# Patient Record
Sex: Male | Born: 1995 | Race: White | Hispanic: No | Marital: Single | Smoking: Never smoker
Health system: Southern US, Community
[De-identification: ages and names within clinical notes are randomized; demographics above are authoritative.]

## PROBLEM LIST (undated history)

## (undated) HISTORY — PX: WISDOM TOOTH EXTRACTION: SHX21

---
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
## 2013-04-04 ENCOUNTER — Ambulatory Visit (INDEPENDENT_AMBULATORY_CARE_PROVIDER_SITE_OTHER): Payer: BC Managed Care – PPO | Admitting: Emergency Medicine

## 2013-04-04 VITALS — BP 140/82 | HR 68 | Temp 97.9°F | Resp 16 | Ht 74.0 in | Wt 226.2 lb

## 2013-04-04 DIAGNOSIS — T23299A Burn of second degree of multiple sites of unspecified wrist and hand, initial encounter: Secondary | ICD-10-CM

## 2013-04-04 DIAGNOSIS — T300 Burn of unspecified body region, unspecified degree: Secondary | ICD-10-CM

## 2013-04-04 DIAGNOSIS — M25549 Pain in joints of unspecified hand: Secondary | ICD-10-CM

## 2013-04-04 MED ORDER — SILVER SULFADIAZINE 1 % EX CREA: TOPICAL_CREAM | CUTANEOUS | Status: DC

## 2013-04-04 MED ORDER — HYDROCODONE-ACETAMINOPHEN 5-325 MG PO TABS: 1.0000 | ORAL_TABLET | Freq: Four times a day (QID) | ORAL | Status: DC | PRN
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
## 2013-04-04 NOTE — Patient Instructions (Addendum)

## 2013-04-07 ENCOUNTER — Ambulatory Visit: Payer: BC Managed Care – PPO | Admitting: Emergency Medicine

## 2013-04-07 DIAGNOSIS — T23202D Burn of second degree of left hand, unspecified site, subsequent encounter: Secondary | ICD-10-CM
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
## 2013-08-22 ENCOUNTER — Ambulatory Visit (INDEPENDENT_AMBULATORY_CARE_PROVIDER_SITE_OTHER): Payer: BC Managed Care – PPO | Admitting: Emergency Medicine

## 2013-08-22 VITALS — BP 120/70 | HR 93 | Temp 97.6°F | Resp 12 | Ht 74.0 in | Wt 217.0 lb

## 2013-08-22 DIAGNOSIS — R591 Generalized enlarged lymph nodes: Secondary | ICD-10-CM

## 2013-08-22 DIAGNOSIS — J029 Acute pharyngitis, unspecified: Secondary | ICD-10-CM

## 2013-08-22 DIAGNOSIS — R599 Enlarged lymph nodes, unspecified: Secondary | ICD-10-CM

## 2013-08-22 DIAGNOSIS — R509 Fever, unspecified: Secondary | ICD-10-CM

## 2013-08-22 LAB — COMPREHENSIVE METABOLIC PANEL
ALT: 28 U/L (ref 0–53)
BUN: 10 mg/dL (ref 6–23)
Potassium: 4.2 mEq/L (ref 3.5–5.3)
Total Bilirubin: 0.6 mg/dL (ref 0.3–1.2)

## 2013-08-22 LAB — POCT CBC
Granulocyte percent: 72.9 %G (ref 37–80)
HCT, POC: 44.1 % (ref 43.5–53.7)
Lymph, poc: 1.7 (ref 0.6–3.4)
MID (cbc): 0.8 (ref 0–0.9)
POC Granulocyte: 6.6 (ref 2–6.9)
POC LYMPH PERCENT: 18.4 %L (ref 10–50)
POC MID %: 8.7 %M (ref 0–12)
Platelet Count, POC: 250 10*3/uL (ref 142–424)
RBC: 4.88 M/uL (ref 4.69–6.13)
RDW, POC: 13.5 %

## 2013-08-22 LAB — POCT RAPID STREP A (OFFICE): Rapid Strep A Screen: NEGATIVE

## 2013-08-22 MED ORDER — FIRST-DUKES MOUTHWASH MT SUSP: OROMUCOSAL | Status: DC

## 2013-08-22 NOTE — Progress Notes (Addendum)
Subjective:   This chart was scribed for Norberto Sorenson, MD at [facility] by Yevette Edwards, Scribe. This pt's care was started at 10:00 AM.   Patient ID: Anthony Richards, male    DOB: [birth year], 17 y.o.   MRN: [id number]  Chief Complaint  Patient presents with  . Sore Throat    started 6 days ago  . Chills  . Nausea  . Generalized Body Aches   HPI  Anthony Richards is a 17 y.o. male who presents to [facility] complaining of a sore throat which began six days . As associated symptoms, the pt has also experienced fatigue, chills, myalgia, nausea, back pain, and neck pain. He also reports swollen lymph glands.  The back pain has since resolved, and the neck pain began yesterday. He states that he slept for 14 hours yesterday due to increased fatigue.   He denies known sick contacts. The pt also denies experiencing a fever.  He visited student health where he was told he had viral pharyngitis and where the strep test performed was negative. The pt denies a h/o mono.   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   Review of Systems  Constitutional: Positive for chills and fatigue. Negative for fever.  HENT: Positive for sore throat.   Gastrointestinal: Positive for nausea.  Musculoskeletal: Positive for back pain, myalgias and neck pain.      Objective:   Physical Exam  Physical Exam  Nursing note and vitals reviewed. Constitutional: He is oriented to person, place, and time. He appears well-developed and well-nourished. No distress.  HENT there is 2+ redness of the posterior pharynx with mild tonsillar enlargement.:  Head: Normocephalic and atraumatic.  Eyes: EOM are normal.  Neck: Neck supple. No tracheal deviation present.  Cardiovascular: Normal rate.   Pulmonary/Chest: Effort normal. No respiratory distress.  Musculoskeletal: Normal range of motion.  Neurological: He is alert and oriented to person, place, and time.  Skin: Skin is warm and dry.  Psychiatric: He  has a normal mood and affect. His behavior is normal.  Results for orders placed in visit on 10/11/12  CULTURE, GROUP A STREP SCREEN      Result Value Range   Organism ID, Bacteria Normal Upper Respiratory Flora     Organism ID, Bacteria No Beta Hemolytic Streptococci Isolated    POCT INFLUENZA A/B      Result Value Range   Influenza A, POC Negative     Influenza B, POC Negative    POCT RAPID STREP A (OFFICE)      Result Value Range   Rapid Strep A Screen Negative  Negative   Vitals: BP 120/70  Pulse 93  Temp(Src) 97.6 F (36.4 C) (Oral)  Resp 12  Ht 6\' 2"  (1.88 m)  Wt 217 lb (98.431 kg)  BMI 27.85 kg/m2  SpO2 100% Results for orders placed in visit on 08/22/13  POCT CBC      Result Value Range   WBC 9.1  4.6 - 10.2 K/uL   Lymph, poc 1.7  0.6 - 3.4   POC LYMPH PERCENT 18.4  10 - 50 %L   MID (cbc) 0.8  0 - 0.9   POC MID % 8.7  0 - 12 %M   POC Granulocyte 6.6  2 - 6.9   Granulocyte percent 72.9  37 - 80 %G   RBC 4.88  4.69 - 6.13 M/uL   Hemoglobin 14.0 (*) 14.1 - 18.1 g/dL  HCT, POC 44.1  43.5 - 53.7 %   MCV 90.4  80 - 97 fL   MCH, POC 28.7  27 - 31.2 pg   MCHC 31.7 (*) 31.8 - 35.4 g/dL   RDW, POC 04.5     Platelet Count, POC 250  142 - 424 K/uL   MPV 10.3  0 - 99.8 fL  POCT RAPID STREP A (OFFICE)      Result Value Range   Rapid Strep A Screen Negative  Negative      Assessment & Plan:  Concerned that this represents mono or CMV. Repeat strep test was done along with a strep culture. Note to be given for school. 10:05 AM- Discussed treatment plan, which includes a CBG,  with the patient, and the patient agreed to the plan.

## 2013-08-22 NOTE — Patient Instructions (Signed)
Sore Throat A sore throat is pain, burning, irritation, or scratchiness of the throat. There is often pain or tenderness when swallowing or talking. A sore throat may be accompanied by other symptoms, such as coughing, sneezing, fever, and swollen neck glands. A sore throat is often the first sign of another sickness, such as a cold, flu, strep throat, or mononucleosis (commonly known as mono). Most sore throats go away without medical treatment. CAUSES  The most common causes of a sore throat include:  A viral infection, such as a cold, flu, or mono.  A bacterial infection, such as strep throat, tonsillitis, or whooping cough.  Seasonal allergies.  Dryness in the air.  Irritants, such as smoke or pollution.  Gastroesophageal reflux disease (GERD). HOME CARE INSTRUCTIONS   Only take over-the-counter medicines as directed by your caregiver.  Drink enough fluids to keep your urine clear or pale yellow.  Rest as needed.  Try using throat sprays, lozenges, or sucking on hard candy to ease any pain (if older than 4 years or as directed).  Sip warm liquids, such as broth, herbal tea, or warm water with honey to relieve pain temporarily. You may also eat or drink cold or frozen liquids such as frozen ice pops.  Gargle with salt water (mix 1 tsp salt with 8 oz of water).  Do not smoke and avoid secondhand smoke.  Put a cool-mist humidifier in your bedroom at night to moisten the air. You can also turn on a hot shower and sit in the bathroom with the door closed for 5 10 minutes. SEEK IMMEDIATE MEDICAL CARE IF:  You have difficulty breathing.  You are unable to swallow fluids, soft foods, or your saliva.  You have increased swelling in the throat.  Your sore throat does not get better in 7 days.  You have nausea and vomiting.  You have a fever or persistent symptoms for more than 2 3 days.  You have a fever and your symptoms suddenly get worse. MAKE SURE YOU:   Understand  these instructions.  Will watch your condition.  Will get help right away if you are not doing well or get worse. Document Released: 10/03/2004 Document Revised: 08/12/2012 Document Reviewed: 05/03/2012 [employer] Patient Information 2014 [employer].  

## 2013-08-24 LAB — CULTURE, GROUP A STREP

## 2013-08-24 LAB — EPSTEIN-BARR VIRUS VCA ANTIBODY PANEL
EBV NA IgG: <3 U/mL (ref <18.0)
EBV VCA IgG: <10 U/mL (ref <18.0)
EBV VCA IgM: <10 U/mL (ref <36.0)

## 2014-01-26 ENCOUNTER — Ambulatory Visit (INDEPENDENT_AMBULATORY_CARE_PROVIDER_SITE_OTHER): Payer: BC Managed Care – PPO | Admitting: Physician Assistant

## 2014-01-26 VITALS — BP 120/76 | HR 75 | Temp 98.3°F | Resp 18 | Ht 74.0 in | Wt 200.0 lb

## 2014-01-26 DIAGNOSIS — Z202 Contact with and (suspected) exposure to infections with a predominantly sexual mode of transmission: Secondary | ICD-10-CM

## 2014-01-26 MED ORDER — AZITHROMYCIN 250 MG PO TABS: 1000.0000 mg | ORAL_TABLET | Freq: Once | ORAL | Status: AC

## 2014-01-26 NOTE — Patient Instructions (Addendum)
I will contact you with your lab results as soon as they are available.   If you have not heard from me in 2 weeks, please contact me.  The fastest way to get your results is to register for My Chart (see the instructions on the last page of this printout).  Chlamydia, Females and Males Chlamydia is an infection that can be found in the vagina, urethra, cervix, rectum and pelvic organs in the male. In the male, it most often causes urethritis. This happens when it infects the tube (urethra) that carries the urine out of the bladder. When Chlamydia causes urethritis, there may be burning with urination. In males, it may also infect the tubes that carry the sperm from the testicle. This causes pain in the testicles and infect the prostate gland. In females, an infection of the pelvic organs is also called PID (pelvic inflammatory disease). PID may be a cause of sudden (acute) lower abdominal/belly (pelvic) pain and fever. But with Chlamydia, the infection sometimes does not cause problems that you notice (asymptomatic). It may cause an abnormal or watery mucous-like discharge from the birth canal (vagina) or penis.  CAUSES  Chlamydia is caused by germs (bacteria) that are spread during sexual contact of the:  Genitals.  Mouth.  Rectum. This infection may also be passed to a newborn baby coming through the infected birth canal. This causes eye and lung infections in the baby. Chlamydia often goes unnoticed. So it is easy to transmit it to a sexual partner without even knowing. SYMPTOMS  In females, symptoms may go unnoticed. Symptoms that are more noticeable can include:  Belly (abdominal) pain.  Painful intercourse.  Watery mucous-like discharge from the vagina.  Miscarriage.  Discomfort when urinating.  Inflammation of the rectum. In males, symptoms include:  Burning with urination.  Pain in the testicles.  Watery mucous-like discharge from the penis. It can cause longstanding  (chronic) pelvic pain after frequent infections. TREATMENT   Chlamydia can be treated with medications which kill germs(antibiotics).  Inform all sexual partners about the infection. All sexual contacts need to be treated.  If you are pregnant, do not take tetracycline type antibiotics.  PID can cause women to not be able to have children (sterile) if left untreated or if half-treated. It does this by scarring the tubes to the uterus (fallopian tubes). They carry the egg needed to form a baby. It is important to finish ALL medications given to you.  Sterility or future tubal (ectopic) pregnancies can occur in fully treated individuals. It is important to follow your prescribed treatment. That will lessen the chances of these problems.  This is a sexually transmitted infection. So you are also at risk for other sexually transmitted diseases. These include: Gonorrhea and HIV (AIDS). Testing may be done for the other sexually transmitted diseases if one disease is detected.  It is important to treat chlamydia as soon as possible. It can cause damage to other organs. HOME CARE INSTRUCTIONS  Finish all medication as prescribed. Incomplete treatment will put you at risk for not being able to have children (sterility) and tubal pregnancy. If one sexually transmitted disease is discovered, often treatment will be started to cover other possible infections.  Only take over-the-counter or prescription medicines for pain, discomfort, or fever as directed by your caregiver.  Rest.  Eat a balanced diet and drink plenty of fluids.  Warning: This infection is contagious. Do not have sex until treatment is completed. Follow up at your caregiver's  office or the clinic to which you were referred. If your diagnosis (learning what is wrong) is confirmed by culture or some other method, your recent sexual contacts need treatment. Even if they are symptom free or have a negative culture or evaluation, they  should be treated.  For the protection of your privacy, test results can not be given over the phone. Make sure you receive the results of your test. Ask how these results are to be obtained if you have not been informed. It is your responsibility to obtain your test results. PREVENTION   Women should use sanitary pads instead of tampons for vaginal discharge.  Wipe front to back after using the toilet and avoid douching.  Test for chlamydia if you are having an IUD inserted.  Practice safe sex, use condoms, have only one sex partner and be sure your sex partner is not having sex with others.  Ask your caregiver to test you for chlamydia at your regular checkups or sooner if you are having symptoms.  Ask for further information if you are pregnant. SEEK IMMEDIATE MEDICAL CARE IF:   You develop an oral temperature above 102 F (38.9 C), not controlled by medications or lasting more than 2 days.  You develop an increase in pain.  You develop any type of abnormal discharge.  You develop vaginal bleeding and it is not time for your period.  You develop painful intercourse. MAKE SURE YOU:   Understand these instructions.  Will watch your condition.  Will get help right away if you are not doing well or get worse. Document Released: 08/26/2005 Document Revised: 11/18/2011 Document Reviewed: 03/04/2013 [facility] Patient Information 2014 [place], [place].

## 2014-01-26 NOTE — Progress Notes (Signed)
   Subjective:    Patient ID: Anthony Richards, male    DOB: 06/18/1996, 18 y.o.   MRN: [id number]   PCP: Lucilla EdinAUB, STEVE A, MD  Chief Complaint  Patient presents with  . STD Check    " States that he got a text msg. from a girl stating she had chlymedia"    Medications, allergies, past medical history, surgical history, family history, social history and problem list reviewed and updated.  HPI  Former partner contacted him reporting diagnosis of chlamydia.   They had one sexual encounter, without a condom. She had been on azithromycin 1 week prior to their encounter for a dental procedure.  Neither have any symptoms. Currently has no partners. Last 30 days, 3 Last year, 11 Lifetime, 11 Inconsistent condom use.  Review of Systems  Constitutional: Negative for fever, chills, diaphoresis, fatigue and unexpected weight change.  Gastrointestinal: Negative for nausea, abdominal pain and diarrhea.  Genitourinary: Negative for dysuria, urgency, frequency, hematuria, discharge, penile swelling, scrotal swelling, difficulty urinating, genital sores, penile pain and testicular pain.  Musculoskeletal: Negative for arthralgias and myalgias.  Skin: Negative for rash.  Allergic/Immunologic: Negative for immunocompromised state.       Objective:   Physical Exam  Vitals reviewed. Constitutional: He is oriented to person, place, and time. He appears well-developed and well-nourished. No distress.  BP 120/76  Pulse 75  Temp(Src) 98.3 F (36.8 C) (Oral)  Resp 18  Ht 6\' 2"  (1.88 m)  Wt 200 lb (90.719 kg)  BMI 25.67 kg/m2  SpO2 100%   Eyes: Conjunctivae are normal. No scleral icterus.  Cardiovascular: Normal rate.   Pulmonary/Chest: Effort normal.  Neurological: He is alert and oriented to person, place, and time.  Skin: Skin is warm and dry.  Psychiatric: He has a normal mood and affect. His behavior is normal.          Assessment & Plan:  1. Exposure to chlamydia Counseled on safer  sex practices. Encouraged d/c tobacco and illicit drug use. - GC/Chlamydia Probe Amp - Hepatitis B surface antigen - Hepatitis B surface antibody - Hepatitis C antibody - HIV antibody - HSV(herpes simplex vrs) 1+2 ab-IgG - RPR - azithromycin (ZITHROMAX) 250 MG tablet; Take 4 tablets (1,000 mg total) by mouth once.  Dispense: 4 tablet; Refill: 0  Return in about 3 months (around 04/28/2014) for test for re-infection.  Fernande Brashelle S. Jaeleen Inzunza, PA-C Physician Assistant-Certified [facility]

## 2014-01-27 ENCOUNTER — Telehealth: Payer: Self-pay | Admitting: Physician Assistant

## 2014-01-27 LAB — HIV ANTIBODY (ROUTINE TESTING W REFLEX): HIV: NONREACTIVE

## 2014-01-27 LAB — HEPATITIS B SURFACE ANTIBODY, QUANTITATIVE: HEPATITIS B-POST: 6.1 m[IU]/mL

## 2014-01-27 LAB — HSV(HERPES SIMPLEX VRS) I + II AB-IGG
HSV 1 GLYCOPROTEIN G AB, IGG: 0.49 IV
HSV 2 GLYCOPROTEIN G AB, IGG: 0.21 IV

## 2014-01-27 LAB — HEPATITIS C ANTIBODY: HCV Ab: NEGATIVE

## 2014-01-27 LAB — HEPATITIS B SURFACE ANTIGEN: HEP B S AG: NEGATIVE

## 2014-01-27 LAB — GC/CHLAMYDIA PROBE AMP
CT PROBE, AMP APTIMA: NEGATIVE
GC PROBE AMP APTIMA: NEGATIVE

## 2014-01-27 LAB — RPR

## 2014-01-27 NOTE — Telephone Encounter (Signed)
Left message on VM.  Labs are all normal except Hep B titer indicates he's not immune. I believe he's completed the series, so recommend a 4th dose and then repeat titer 4 weeks after.

## 2014-01-31 ENCOUNTER — Ambulatory Visit (INDEPENDENT_AMBULATORY_CARE_PROVIDER_SITE_OTHER): Payer: BC Managed Care – PPO | Admitting: Internal Medicine

## 2014-01-31 DIAGNOSIS — Z23 Encounter for immunization: Secondary | ICD-10-CM

## 2014-01-31 NOTE — Progress Notes (Signed)
   Subjective:    Patient ID: Anthony Richards, male    DOB: 19-Aug-1996, 18 y.o.   MRN: [id number]  HPI  Patient here today for Hep B only.    Review of Systems     Objective:   Physical Exam Immunization [facility]       Assessment & Plan:  Hepatitis B immunization

## 2015-06-13 ENCOUNTER — Encounter: Payer: Self-pay | Admitting: Emergency Medicine

## 2016-12-06 ENCOUNTER — Ambulatory Visit (INDEPENDENT_AMBULATORY_CARE_PROVIDER_SITE_OTHER): Payer: BLUE CROSS/BLUE SHIELD | Admitting: Family Medicine

## 2016-12-06 ENCOUNTER — Ambulatory Visit (INDEPENDENT_AMBULATORY_CARE_PROVIDER_SITE_OTHER): Payer: BLUE CROSS/BLUE SHIELD

## 2016-12-06 ENCOUNTER — Encounter: Payer: Self-pay | Admitting: Family Medicine

## 2016-12-06 VITALS — BP 124/60 | HR 63 | Resp 16

## 2016-12-06 DIAGNOSIS — G8929 Other chronic pain: Secondary | ICD-10-CM

## 2016-12-06 DIAGNOSIS — S335XXA Sprain of ligaments of lumbar spine, initial encounter: Secondary | ICD-10-CM

## 2016-12-06 DIAGNOSIS — M545 Low back pain: Secondary | ICD-10-CM

## 2016-12-06 MED ORDER — MELOXICAM 15 MG PO TABS: 15.0000 mg | ORAL_TABLET | Freq: Every day | ORAL | 0 refills | Status: AC

## 2016-12-06 NOTE — Patient Instructions (Addendum)
   IF you received an x-ray today, you will receive an invoice from [facility]. Please contact [facility] at [phone] with questions or concerns regarding your invoice.   IF you received labwork today, you will receive an invoice from [facility]. Please contact [facility] at [phone] with questions or concerns regarding your invoice.   Our billing staff will not be able to assist you with questions regarding bills from these companies.  You will be contacted with the lab results as soon as they are available. The fastest way to get your results is to activate your My Chart account. Instructions are located on the last page of this paperwork. If you have not heard from us regarding the results in 2 weeks, please contact this office.     Low Back Sprain Rehab Ask your health care provider which exercises are safe for you. Do exercises exactly as told by your health care provider and adjust them as directed. It is normal to feel mild stretching, pulling, tightness, or discomfort as you do these exercises, but you should stop right away if you feel sudden pain or your pain gets worse. Do not begin these exercises until told by your health care provider. Stretching and range of motion exercises These exercises warm up your muscles and joints and improve the movement and flexibility of your back. These exercises also help to relieve pain, numbness, and tingling. Exercise A: Lumbar rotation   1. Lie on your back on a firm surface and bend your knees. 2. Straighten your arms out to your sides so each arm forms an "L" shape with a side of your body (a 90 degree angle). 3. Slowly move both of your knees to one side of your body until you feel a stretch in your lower back. Try not to let your shoulders move off of the floor. 4. Hold for __________ seconds. 5. Tense your abdominal muscles and slowly move your knees back to the starting position. 6. Repeat this exercise on the  other side of your body. Repeat __________ times. Complete this exercise __________ times a day. Exercise B: Prone extension on elbows   1. Lie on your abdomen on a firm surface. 2. Prop yourself up on your elbows. 3. Use your arms to help lift your chest up until you feel a gentle stretch in your abdomen and your lower back.  This will place some of your body weight on your elbows. If this is uncomfortable, try stacking pillows under your chest.  Your hips should stay down, against the surface that you are lying on. Keep your hip and back muscles relaxed. 4. Hold for __________ seconds. 5. Slowly relax your upper body and return to the starting position. Repeat __________ times. Complete this exercise __________ times a day. Strengthening exercises These exercises build strength and endurance in your back. Endurance is the ability to use your muscles for a long time, even after they get tired. Exercise C: Pelvic tilt  1. Lie on your back on a firm surface. Bend your knees and keep your feet flat. 2. Tense your abdominal muscles. Tip your pelvis up toward the ceiling and flatten your lower back into the floor.  To help with this exercise, you may place a small towel under your lower back and try to push your back into the towel. 3. Hold for __________ seconds. 4. Let your muscles relax completely before you repeat this exercise. Repeat __________ times. Complete this exercise __________ times a day. Exercise   D: Alternating arm and leg raises   1. Get on your hands and knees on a firm surface. If you are on a hard floor, you may want to use padding to cushion your knees, such as an exercise mat. 2. Line up your arms and legs. Your hands should be below your shoulders, and your knees should be below your hips. 3. Lift your left leg behind you. At the same time, raise your right arm and straighten it in front of you.  Do not lift your leg higher than your hip.  Do not lift your arm  higher than your shoulder.  Keep your abdominal and back muscles tight.  Keep your hips facing the ground.  Do not arch your back.  Keep your balance carefully, and do not hold your breath. 4. Hold for __________ seconds. 5. Slowly return to the starting position and repeat with your right leg and your left arm. Repeat __________ times. Complete this exercise __________ times a day. Exercise E: Abdominal set with straight leg raise   1. Lie on your back on a firm surface. 2. Bend one of your knees and keep your other leg straight. 3. Tense your abdominal muscles and lift your straight leg up, 4-6 inches (10-15 cm) off the ground. 4. Keep your abdominal muscles tight and hold for __________ seconds.  Do not hold your breath.  Do not arch your back. Keep it flat against the ground. 5. Keep your abdominal muscles tense as you slowly lower your leg back to the starting position. 6. Repeat with your other leg. Repeat __________ times. Complete this exercise __________ times a day. Posture and body mechanics   Body mechanics refers to the movements and positions of your body while you do your daily activities. Posture is part of body mechanics. Good posture and healthy body mechanics can help to relieve stress in your body's tissues and joints. Good posture means that your spine is in its natural S-curve position (your spine is neutral), your shoulders are pulled back slightly, and your head is not tipped forward. The following are general guidelines for applying improved posture and body mechanics to your everyday activities. Standing    When standing, keep your spine neutral and your feet about hip-width apart. Keep a slight bend in your knees. Your ears, shoulders, and hips should line up.  When you do a task in which you stand in one place for a long time, place one foot up on a stable object that is 2-4 inches (5-10 cm) high, such as a footstool. This helps keep your spine  neutral. Sitting    When sitting, keep your spine neutral and keep your feet flat on the floor. Use a footrest, if necessary, and keep your thighs parallel to the floor. Avoid rounding your shoulders, and avoid tilting your head forward.  When working at a desk or a computer, keep your desk at a height where your hands are slightly lower than your elbows. Slide your chair under your desk so you are close enough to maintain good posture.  When working at a computer, place your monitor at a height where you are looking straight ahead and you do not have to tilt your head forward or downward to look at the screen. Resting    When lying down and resting, avoid positions that are most painful for you.  If you have pain with activities such as sitting, bending, stooping, or squatting (flexion-based activities), lie in a position in which   your body does not bend very much. For example, avoid curling up on your side with your arms and knees near your chest (fetal position).  If you have pain with activities such as standing for a long time or reaching with your arms (extension-based activities), lie with your spine in a neutral position and bend your knees slightly. Try the following positions:  Lying on your side with a pillow between your knees.  Lying on your back with a pillow under your knees. Lifting    When lifting objects, keep your feet at least shoulder-width apart and tighten your abdominal muscles.  Bend your knees and hips and keep your spine neutral. It is important to lift using the strength of your legs, not your back. Do not lock your knees straight out.  Always ask for help to lift heavy or awkward objects. This information is not intended to replace advice given to you by your health care provider. Make sure you discuss any questions you have with your health care provider. Document Released: 08/26/2005 Document Revised: 05/02/2016 Document Reviewed: 06/07/2015 Elsevier  Interactive Patient Education  2017 Elsevier Inc.  

## 2016-12-06 NOTE — Progress Notes (Signed)
Subjective:    Patient ID: Anthony Richards, male    DOB: 01/30/1996, 21 y.o.   MRN: [id number]  12/06/2016  Back Pain (pulled back 1 1/2 year ago when lifting weights . rest resolved now irritated low back into right leg)   HPI This 21 y.o. male presents for evaluation of low back pain onset 1.5 years ago.  Was lifting weights at gym with friends.  Had acute onset of lower back pain.  Severe pain for three days.  Then, last summer started working out again. With increased intensity of wieghts like squats, pain will recur.  There have been times where pain is severe.  Some nights, cannot get comfortable; keeping up at night.  Two weeks ago, sitting and putting socks on R foot; with flexing and extending, with a pop and severe pain.  Long car rides are horrible.  Lower back off to R side.  There will be times where pain radiates into leg. Then sometimes pain with radiate on R groin.  Intermittent n/t/burning down leg and groin.  Normal b/b function. No saddle paresthesias.  If on a long trip, R testicle pain but not numbness.  Take Tylenol or ASA; usually takes every day upon awakening.  No previous exam.  Immunization History  Administered Date(s) Administered  . HPV Quadrivalent 12/22/2011, 03/15/2012  . Hepatitis B, adult 01/31/2014  . Influenza Split 06/15/2012  . Influenza-Unspecified 06/20/2014   BP Readings from Last 3 Encounters:  12/06/16 124/60  01/26/14 120/76  08/22/13 120/70   Wt Readings from Last 3 Encounters:  01/26/14 200 lb (90.7 kg)  08/22/13 217 lb (98.4 kg) (96 %, Z= 1.81)*  04/04/13 226 lb 3.2 oz (102.6 kg) (98 %, Z= 2.02)*   * Growth percentiles are based on CDC 2-20 Years data.    Review of Systems  Constitutional: Negative for activity change, appetite change, chills, diaphoresis, fatigue and fever.  Respiratory: Negative for cough and shortness of breath.   Cardiovascular: Negative for chest pain, palpitations and leg swelling.  Gastrointestinal: Negative for  abdominal pain, diarrhea, nausea and vomiting.  Endocrine: Negative for cold intolerance, heat intolerance, polydipsia, polyphagia and polyuria.  Genitourinary: Negative for decreased urine volume and difficulty urinating.  Musculoskeletal: Positive for back pain.  Skin: Negative for color change, rash and wound.  Neurological: Negative for dizziness, tremors, seizures, syncope, facial asymmetry, speech difficulty, weakness, light-headedness, numbness and headaches.  Psychiatric/Behavioral: Negative for dysphoric mood and sleep disturbance. The patient is not nervous/anxious.     No past medical history on file. Past Surgical History:  Procedure Laterality Date  . WISDOM TOOTH EXTRACTION     Allergies  Allergen Reactions  . Penicillins Rash    Social History   Social History  . Marital status: Single    Spouse name: n/a  . Number of children: 0  . Years of education: N/A   Occupational History  . student    Social History Main Topics  . Smoking status: Never Smoker  . Smokeless tobacco: Current User    Types: Chew     Comment: occasional  . Alcohol use 0.0 - 10.0 oz/week    0 - 20 drink(s) per week     Comment: none during the summer, more on weekends during the school year  . Drug use: Yes    Frequency: 7.0 times per week    Types: Marijuana     Comment: trying to quit  . Sexual activity: Yes    Partners: Female  Birth control/ protection: None   Other Topics Concern  . Not on file   Social History Narrative   Consulting civil engineer at [employer], Education officer, community. Considering MBA and going into pharmaceutical.   Mother is a CMA and works at [employer]. 2 younger brothers.   Family adopted a girl from [place] Summer 2014.   No family history on file.     Objective:    BP 124/60 (BP Location: Left Arm, Patient Position: Sitting, Cuff Size: Large)   Pulse 63   Resp 16   SpO2 100%  Physical Exam  Constitutional: He is oriented to person, place, and time. He appears well-developed and  well-nourished. No distress.  HENT:  Head: Normocephalic and atraumatic.  Eyes: Conjunctivae and EOM are normal. Pupils are equal, round, and reactive to light.  Neck: Normal range of motion. Neck supple. Carotid bruit is not present. No thyromegaly present.  Cardiovascular: Normal rate, regular rhythm, normal heart sounds and intact distal pulses.  Exam reveals no gallop and no friction rub.   No murmur heard. Pulmonary/Chest: Effort normal and breath sounds normal. He has no wheezes. He has no rales.  Musculoskeletal:       Lumbar back: He exhibits normal range of motion, no tenderness, no bony tenderness, no pain, no spasm and normal pulse.  Lumbar spine:  Non-tender midline; non-tender paraspinal regions B.  Straight leg raises negative B; toe and heel walking intact; marching intact; motor 5/5 BLE.  Full ROM lumbar spine without limitation.   Lymphadenopathy:    He has no cervical adenopathy.  Neurological: He is alert and oriented to person, place, and time. No cranial nerve deficit.  Skin: Skin is warm and dry. No rash noted. He is not diaphoretic.  Psychiatric: He has a normal mood and affect. His behavior is normal.  Nursing note and vitals reviewed.       Assessment & Plan:   1. Chronic right-sided low back pain without sciatica   2. Lumbar sprain, initial encounter    -new onset lower back pain in past 1.5 years; obtain lumbar spine films due to chronicity of pain; rx for Meloxicam provided.  Recommend rest, daily home exercise program. If no improvement in 2-4 weeks, refer to ortho and/or physical therapy.   Orders Placed This Encounter  Procedures  . DG Lumbar Spine Complete    Standing Status:   Future    Number of Occurrences:   1    Standing Expiration Date:   12/06/2017    Order Specific Question:   Reason for Exam (SYMPTOM  OR DIAGNOSIS REQUIRED)    Answer:   R lower back pain    Order Specific Question:   Preferred imaging location?    Answer:   External    Meds ordered this encounter  Medications  . meloxicam (MOBIC) 15 MG tablet    Sig: Take 1 tablet (15 mg total) by mouth daily.    Dispense:  30 tablet    Refill:  0    No Follow-up on file.   Kristi Paulita Fujita, M.D. [facility] at [facility] previously [facility] [address] [place], [place]  [address] [phone] phone [phone] fax

## 2017-09-04 DIAGNOSIS — H10413 Chronic giant papillary conjunctivitis, bilateral: Secondary | ICD-10-CM | POA: Diagnosis not present

## 2017-11-19 IMAGING — DX DG LUMBAR SPINE COMPLETE 4+V
5 series · 5 of 5 positions shown · non-contrast
Comparison: None.

CLINICAL DATA: Right lower back pain

EXAM:
LUMBAR SPINE - COMPLETE 4+ VIEW

[l-spine ap]
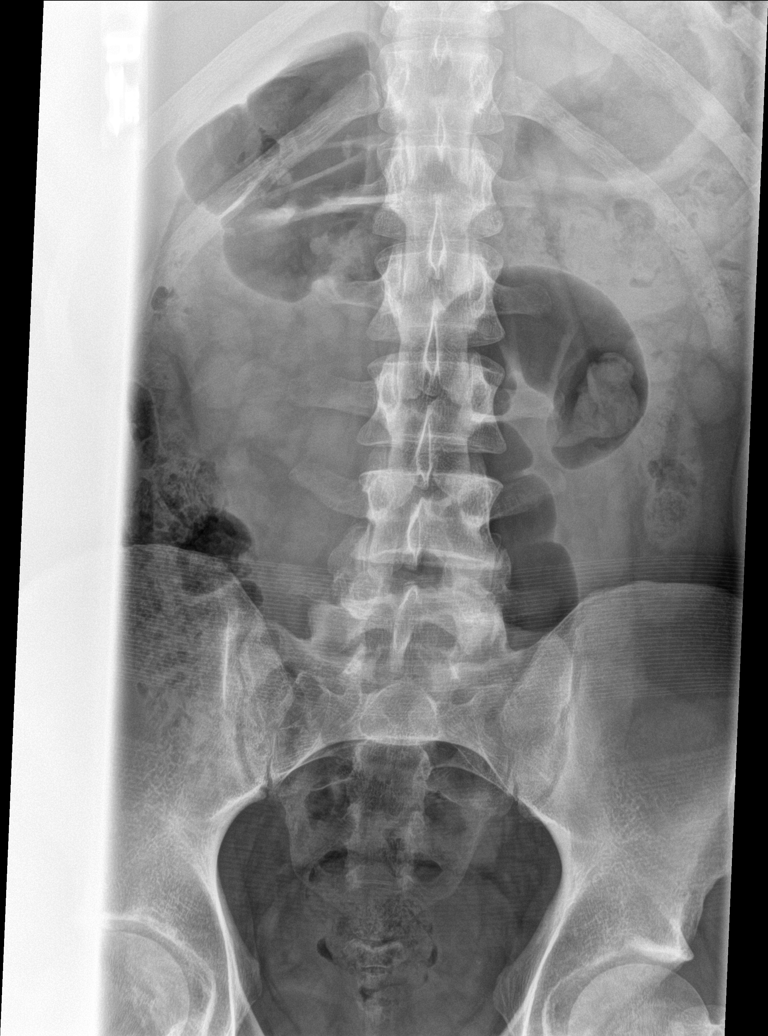

[l-spine obl (1 of 2)]
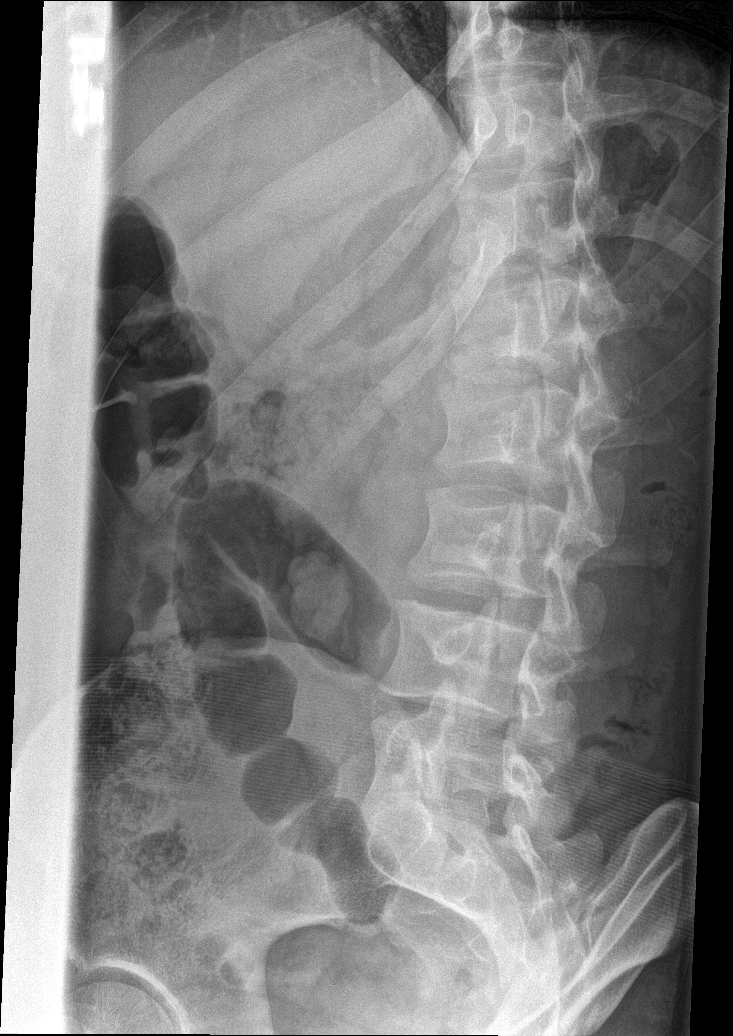

[l-spine obl (2 of 2)]
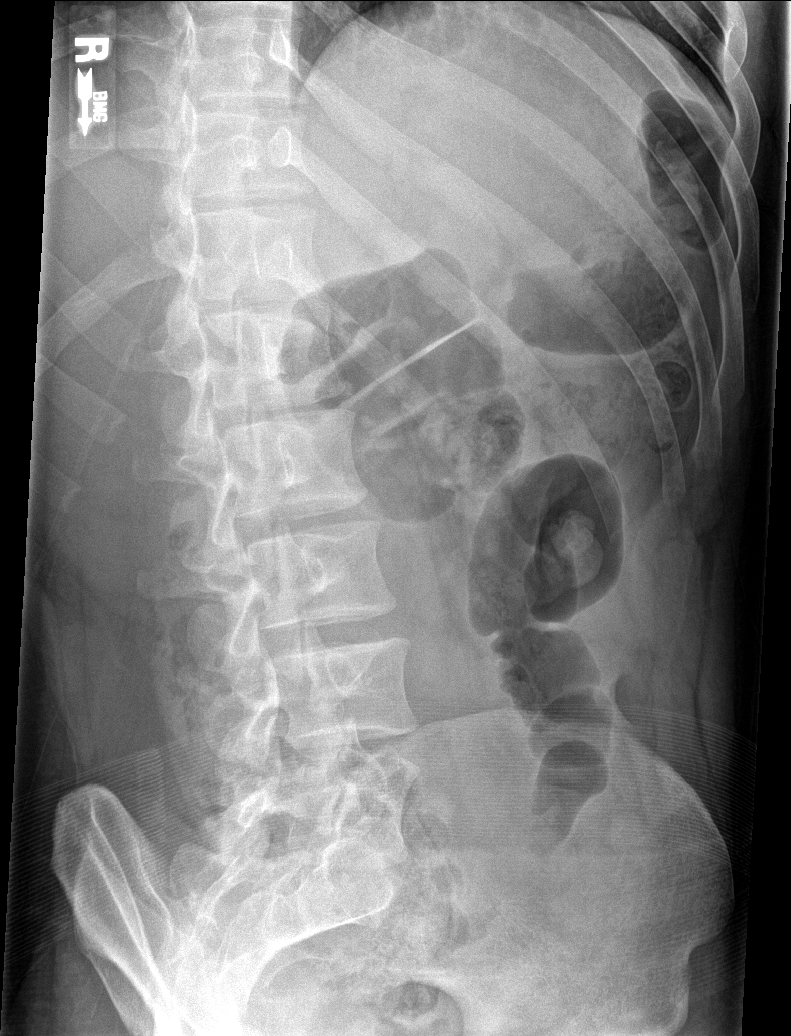

[l-spine lat]
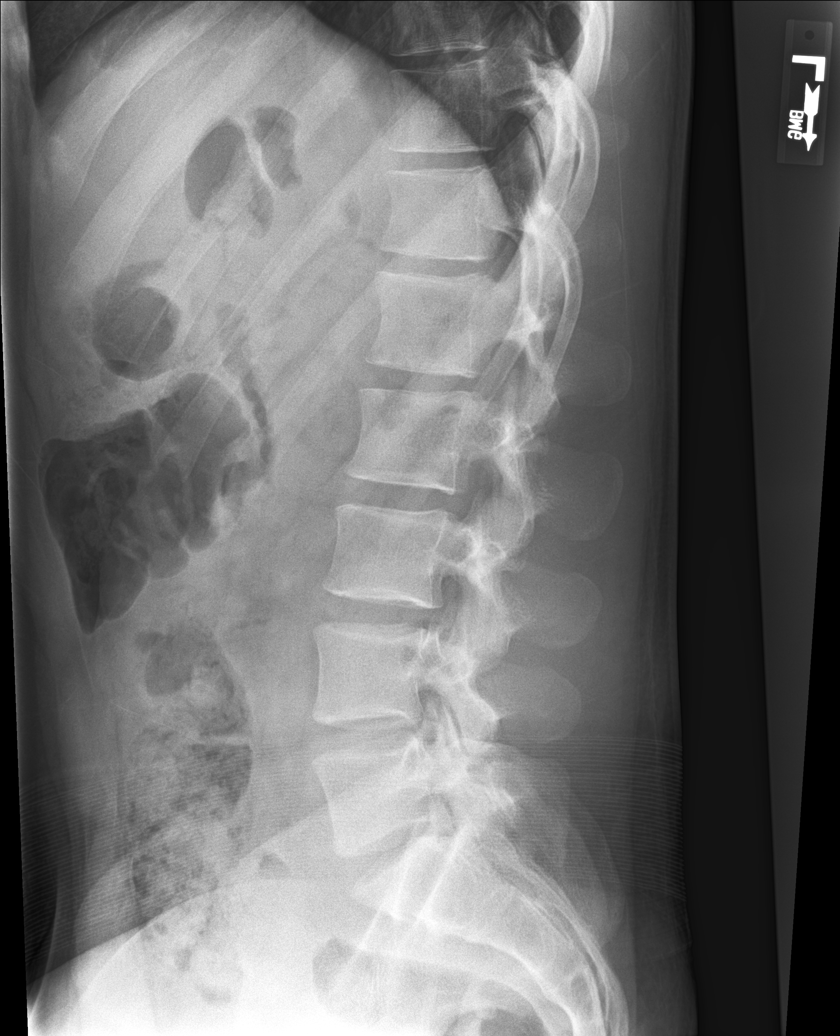

[l-spine l5-s1]
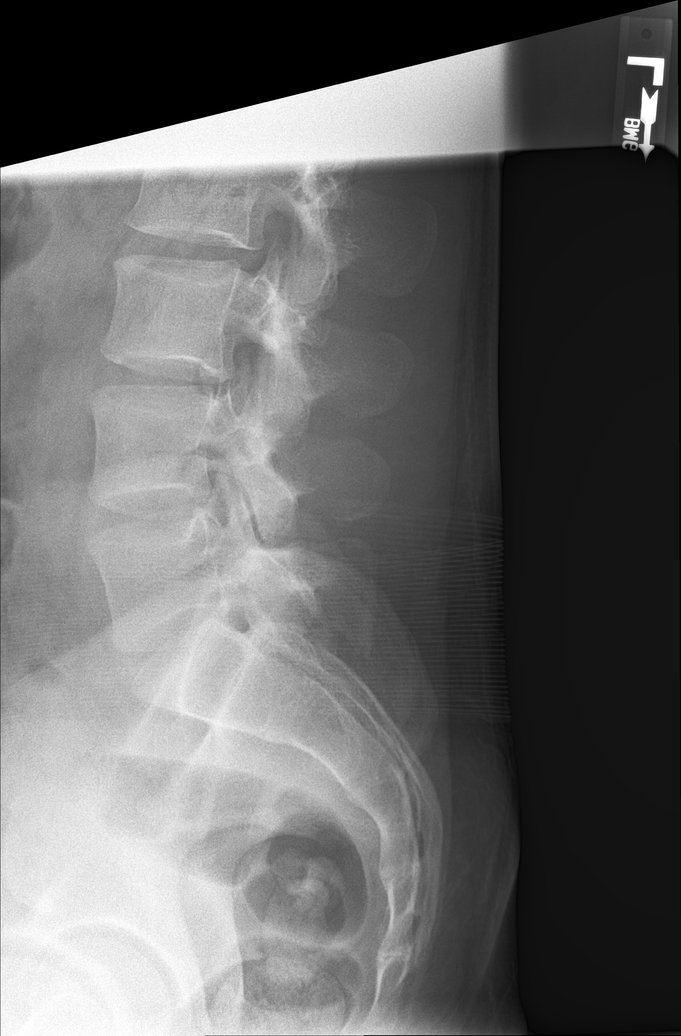

[5 of 5 positions shown; findings below may reference images not displayed]

FINDINGS: There is no evidence of lumbar spine fracture. Alignment is normal.
Intervertebral disc spaces are maintained.
IMPRESSION: Negative.

## 2018-01-23 ENCOUNTER — Encounter: Payer: Self-pay | Admitting: Family Medicine

## 2018-01-28 ENCOUNTER — Encounter: Payer: Self-pay | Admitting: Family Medicine

## 2018-02-23 DIAGNOSIS — R300 Dysuria: Secondary | ICD-10-CM | POA: Diagnosis not present

## 2018-09-14 DIAGNOSIS — R6889 Other general symptoms and signs: Secondary | ICD-10-CM | POA: Diagnosis not present

## 2018-09-14 DIAGNOSIS — J9801 Acute bronchospasm: Secondary | ICD-10-CM | POA: Diagnosis not present

## 2018-09-14 DIAGNOSIS — J200 Acute bronchitis due to Mycoplasma pneumoniae: Secondary | ICD-10-CM | POA: Diagnosis not present
